# Patient Record
Sex: Female | Born: 1950 | Race: White | Hispanic: No | Marital: Married | Smoking: Never smoker
Health system: Southern US, Community
[De-identification: ages and names within clinical notes are randomized; demographics above are authoritative.]

## PROBLEM LIST (undated history)

## (undated) DIAGNOSIS — I4891 Unspecified atrial fibrillation: Secondary | ICD-10-CM

## (undated) HISTORY — PX: ABDOMINAL HYSTERECTOMY: SHX81

---

## 2013-03-30 ENCOUNTER — Encounter (HOSPITAL_COMMUNITY): Payer: Self-pay | Admitting: *Deleted

## 2013-03-30 ENCOUNTER — Emergency Department (HOSPITAL_COMMUNITY)
Admission: EM | Admit: 2013-03-30 | Discharge: 2013-03-30 | Disposition: A | Discharge status: Home / Self Care | Payer: BC Managed Care – PPO | Attending: Emergency Medicine | Admitting: Emergency Medicine

## 2013-03-30 ENCOUNTER — Emergency Department (HOSPITAL_COMMUNITY): Payer: BC Managed Care – PPO

## 2013-03-30 DIAGNOSIS — M25512 Pain in left shoulder: Secondary | ICD-10-CM

## 2013-03-30 DIAGNOSIS — M25519 Pain in unspecified shoulder: Secondary | ICD-10-CM | POA: Insufficient documentation

## 2013-03-30 DIAGNOSIS — Z7982 Long term (current) use of aspirin: Secondary | ICD-10-CM | POA: Insufficient documentation

## 2013-03-30 DIAGNOSIS — R52 Pain, unspecified: Secondary | ICD-10-CM | POA: Insufficient documentation

## 2013-03-30 MED ORDER — HYDROCODONE-ACETAMINOPHEN 5-325 MG PO TABS: 1.0000 | ORAL_TABLET | Freq: Four times a day (QID) | ORAL | Status: AC | PRN

## 2013-03-30 NOTE — ED Notes (Signed)
Pt c/o left shoulder soreness. Reports pain started on yesterday afternoon, denies injury. States shoulder is swollen and warm to touch. Reports hot shower improved pain slightly.

## 2013-03-30 NOTE — ED Provider Notes (Signed)
History     CSN: [id number]  Arrival date & time 03/30/13  1239   First MD Initiated Contact with Patient 03/30/13 1254      Chief Complaint  Patient presents with  . Shoulder Pain    (Consider location/radiation/quality/duration/timing/severity/associated sxs/prior treatment) HPI Comments: Patient presents emergency department with chief complaint of left shoulder pain. Patient states that she has been sore for quite a while, but that her pain recently worsened yesterday afternoon. She endorses repetitive shoulder movement, and she is a Runner, broadcasting/film/video, and rice on the board frequently. She also states it is worse while using the computer mouse. She states that her pain is 8/10. It is worsened with movement. She has tried aspirin and ice with some relief. She denies any traumatic event or injury. She denies fever, chills, nausea, and vomiting.  The history is provided by the patient. No language interpreter was used.    History reviewed. No pertinent past medical history.  Past Surgical History  Procedure Laterality Date  . Abdominal hysterectomy      History reviewed. No pertinent family history.  History  Substance Use Topics  . Smoking status: Not on file  . Smokeless tobacco: Not on file  . Alcohol Use: No    OB History   Grav Para Term Preterm Abortions TAB SAB Ect Mult Living                  Review of Systems  All other systems reviewed and are negative.    Allergies  Ibuprofen  Home Medications   Current Outpatient Rx  Name  Route  Sig  Dispense  Refill  . aspirin 81 MG tablet   Oral   Take 405 mg by mouth daily.         Marland Kitchen HYDROcodone-acetaminophen (NORCO/VICODIN) 5-325 MG per tablet   Oral   Take 1 tablet by mouth every 6 (six) hours as needed for pain.   13 tablet   0     BP 127/97  Pulse 97  Temp(Src) 98.4 F (36.9 C) (Oral)  Resp 16  SpO2 99%  Physical Exam  Nursing note and vitals reviewed. Constitutional: She is oriented to person,  place, and time. She appears well-developed and well-nourished.  HENT:  Head: Normocephalic and atraumatic.  Eyes: Conjunctivae and EOM are normal.  Neck: Normal range of motion.  Cardiovascular: Normal rate and intact distal pulses.   Brisk capillary refill  Pulmonary/Chest: Effort normal.  Abdominal: She exhibits no distension.  Musculoskeletal: Normal range of motion. She exhibits tenderness.  Pain over the left biceps tendon, positive Hawkins-Kennedy test, empty can test deferred secondary to pain, range of motion and strength limited secondary to pain, distal sensation intact  Neurological: She is alert and oriented to person, place, and time.  Sensation intact bilaterally  Skin: Skin is dry.  No redness, swelling, or increased warmth of left shoulder, no signs of infection  Psychiatric: She has a normal mood and affect. Her behavior is normal. Judgment and thought content normal.    ED Course  Procedures (including critical care time)  Labs Reviewed - No data to display No results found.   1. Shoulder pain, acute, left       MDM  Patient with left shoulder pain. Suspect biceps tendinitis, however cannot rule out adhesive capsulitis and shoulder impingement syndrome, or rotator cuff problem. Will refer to orthopedics for further evaluation. No obvious bony deformity or abnormality. Plain films not warranted at this time. Will prescribe Norco  for pain. Patient recommended to ice the affected area, and to stretch as tolerated. Patient understands and agrees with the plan. She is stable and ready for discharge.        Roxy Horseman, PA-C 03/30/13 1538

## 2013-03-30 NOTE — ED Provider Notes (Signed)
Medical screening examination/treatment/procedure(s) were performed by non-physician practitioner and as supervising physician I was immediately available for consultation/collaboration.  Emerald Shor, MD 03/30/13 1750 

## 2014-11-12 ENCOUNTER — Other Ambulatory Visit: Payer: Self-pay

## 2015-10-20 ENCOUNTER — Emergency Department (HOSPITAL_BASED_OUTPATIENT_CLINIC_OR_DEPARTMENT_OTHER)
Admission: EM | Admit: 2015-10-20 | Discharge: 2015-10-20 | Disposition: A | Discharge status: Home / Self Care | Payer: Medicare HMO | Attending: Emergency Medicine | Admitting: Emergency Medicine

## 2015-10-20 ENCOUNTER — Encounter (HOSPITAL_BASED_OUTPATIENT_CLINIC_OR_DEPARTMENT_OTHER): Payer: Self-pay | Admitting: Adult Health

## 2015-10-20 DIAGNOSIS — Z7982 Long term (current) use of aspirin: Secondary | ICD-10-CM | POA: Insufficient documentation

## 2015-10-20 DIAGNOSIS — M25512 Pain in left shoulder: Secondary | ICD-10-CM | POA: Insufficient documentation

## 2015-10-20 DIAGNOSIS — M25511 Pain in right shoulder: Secondary | ICD-10-CM | POA: Diagnosis not present

## 2015-10-20 DIAGNOSIS — R509 Fever, unspecified: Secondary | ICD-10-CM | POA: Diagnosis not present

## 2015-10-20 DIAGNOSIS — M542 Cervicalgia: Secondary | ICD-10-CM

## 2015-10-20 LAB — CBC WITH DIFFERENTIAL/PLATELET
BASOS PCT: 0 %
Basophils Absolute: 0 10*3/uL (ref 0.0–0.1)
Eosinophils Absolute: 0.1 10*3/uL (ref 0.0–0.7)
Eosinophils Relative: 1 %
HEMATOCRIT: 44 % (ref 36.0–46.0)
Hemoglobin: 14.5 g/dL (ref 12.0–15.0)
Lymphocytes Relative: 15 %
Lymphs Abs: 1.2 10*3/uL (ref 0.7–4.0)
MCH: 28.9 pg (ref 26.0–34.0)
MCHC: 33 g/dL (ref 30.0–36.0)
MCV: 87.6 fL (ref 78.0–100.0)
MONO ABS: 0.7 10*3/uL (ref 0.1–1.0)
MONOS PCT: 8 %
NEUTROS ABS: 6.3 10*3/uL (ref 1.7–7.7)
Neutrophils Relative %: 76 %
Platelets: 254 10*3/uL (ref 150–400)
RBC: 5.02 MIL/uL (ref 3.87–5.11)
RDW: 14 % (ref 11.5–15.5)
WBC: 8.3 10*3/uL (ref 4.0–10.5)

## 2015-10-20 LAB — URINALYSIS, ROUTINE W REFLEX MICROSCOPIC
BILIRUBIN URINE: NEGATIVE
Glucose, UA: NEGATIVE mg/dL
HGB URINE DIPSTICK: NEGATIVE
Ketones, ur: NEGATIVE mg/dL
Nitrite: NEGATIVE
PH: 6 (ref 5.0–8.0)
Protein, ur: NEGATIVE mg/dL
SPECIFIC GRAVITY, URINE: 1.013 (ref 1.005–1.030)
UROBILINOGEN UA: 0.2 mg/dL (ref 0.0–1.0)

## 2015-10-20 LAB — COMPREHENSIVE METABOLIC PANEL
ALBUMIN: 3.9 g/dL (ref 3.5–5.0)
ALK PHOS: 59 U/L (ref 38–126)
ALT: 29 U/L (ref 14–54)
AST: 29 U/L (ref 15–41)
Anion gap: 5 (ref 5–15)
BUN: 17 mg/dL (ref 6–20)
CALCIUM: 9.1 mg/dL (ref 8.9–10.3)
CO2: 27 mmol/L (ref 22–32)
CREATININE: 0.81 mg/dL (ref 0.44–1.00)
Chloride: 105 mmol/L (ref 101–111)
GFR calc Af Amer: >60 mL/min (ref >60)
GFR calc non Af Amer: >60 mL/min (ref >60)
GLUCOSE: 114 mg/dL — AB (ref 65–99)
Potassium: 4.4 mmol/L (ref 3.5–5.1)
SODIUM: 137 mmol/L (ref 135–145)
Total Bilirubin: 0.8 mg/dL (ref 0.3–1.2)
Total Protein: 7.8 g/dL (ref 6.5–8.1)

## 2015-10-20 LAB — URINE MICROSCOPIC-ADD ON

## 2015-10-20 LAB — I-STAT CG4 LACTIC ACID, ED: LACTIC ACID, VENOUS: 1.59 mmol/L (ref 0.5–2.0)

## 2015-10-20 MED ORDER — CYCLOBENZAPRINE HCL 10 MG PO TABS: 10.0000 mg | ORAL_TABLET | Freq: Two times a day (BID) | ORAL | Status: AC | PRN

## 2015-10-20 NOTE — ED Notes (Addendum)
Presents with neck and shoulder that is described as severe-difficulty moving head from side to side began Saturday-denies photobobia, rash, nausea, headache-endorses low grade fever of 100.7 and generalized fatigue-taking asa and flexeril with no relief. Pain described as throbbing.

## 2015-10-20 NOTE — ED Notes (Signed)
Patient verbalizes understanding of discharge follow-up and medications.  Patient stable and ambulatory.

## 2015-10-20 NOTE — ED Provider Notes (Signed)
CSN: [id number]     Arrival date & time 10/20/15  1909 History  By signing my name below, I, Gwenyth Ober, attest that this documentation has been prepared under the direction and in the presence of Marily Memos, MD.  Electronically Signed: Gwenyth Ober, ED Scribe. 10/20/2015. 8:24 PM.   Chief Complaint  Patient presents with  . Neck Pain   The history is provided by the patient. No language interpreter was used.    HPI Comments: Kimberly Johnston is a 64 y.o. female who presents to the Emergency Department complaining of constant, moderate, throbbing, posterior neck and bilateral shoulder pain that started last night. She states fever of 100.7 measured orally as associated symptoms. Pt also notes mild pain with swallowing that occurred yesterday, but resolved. Her pain becomes worse with moving her head. Pt tried Flexeril and aspirin PTA with some relief. She last took treatment 3 hours ago which improved her symptoms. Pt denies sick contact, recent injuries or regular OTC medications. She also denies HA, nausea, vomiting and rash as associated symptoms.  History reviewed. No pertinent past medical history. Past Surgical History  Procedure Laterality Date  . Abdominal hysterectomy     History reviewed. No pertinent family history. Social History  Substance Use Topics  . Smoking status: None  . Smokeless tobacco: None  . Alcohol Use: No   OB History    No data available     Review of Systems  Constitutional: Positive for fever.  HENT: Negative for trouble swallowing.   Gastrointestinal: Negative for nausea and vomiting.  Musculoskeletal: Positive for arthralgias and neck pain.  Skin: Negative for rash.  Neurological: Negative for headaches.  All other systems reviewed and are negative.  Allergies  Ibuprofen  Home Medications   Prior to Admission medications   Medication Sig Start Date End Date Taking? Authorizing Provider  aspirin 81 MG tablet Take 405 mg by  mouth daily.    Historical Provider, MD  cyclobenzaprine (FLEXERIL) 10 MG tablet Take 1 tablet (10 mg total) by mouth 2 (two) times daily as needed for muscle spasms. 10/20/15   Marily Memos, MD  HYDROcodone-acetaminophen (NORCO/VICODIN) 5-325 MG per tablet Take 1 tablet by mouth every 6 (six) hours as needed for pain. 03/30/13   Roxy Horseman, PA-C   BP 116/75 mmHg  Pulse 71  Temp(Src) 99 F (37.2 C) (Oral)  Resp 18  SpO2 98% Physical Exam  Constitutional: She is oriented to person, place, and time. She appears well-developed and well-nourished. No distress.  Afebrile  HENT:  Head: Normocephalic and atraumatic.  Mouth/Throat: No oropharyngeal exudate.  Mild erythema to back of throat  Eyes: Conjunctivae and EOM are normal. Pupils are equal, round, and reactive to light.  Neck: Normal range of motion. Neck supple. No rigidity. No tracheal deviation present.  Mild swelling of anterior cervical nodes, but nontender No midline cervical tenderness Mild paraspinal cervical tenderness  Cardiovascular: Normal rate, regular rhythm and normal heart sounds.   No tachycardia  Pulmonary/Chest: Effort normal and breath sounds normal. No respiratory distress. She has no wheezes.  Abdominal: Soft. She exhibits no distension. There is no tenderness. There is no rebound and no guarding.  Neurological: She is alert and oriented to person, place, and time. No cranial nerve deficit.  Skin: Skin is warm and dry. No rash noted.  Psychiatric: She has a normal mood and affect. Her behavior is normal.  Nursing note and vitals reviewed.   ED Course  Procedures   COORDINATION OF CARE:  8:20 PM Discussed low suspicion for meningitis, but gave strict return precautions including worsening fever, neck pain, petechiae rash and neurological deficits. Discussed treatment plan with pt which includes Flexeril. Pt agreed to plan.   Labs Review Labs Reviewed  COMPREHENSIVE METABOLIC PANEL - Abnormal; Notable for  the following:    Glucose, Bld 114 (*)    All other components within normal limits  URINALYSIS, ROUTINE W REFLEX MICROSCOPIC (NOT AT [facility]) - Abnormal; Notable for the following:    Leukocytes, UA TRACE (*)    All other components within normal limits  CBC WITH DIFFERENTIAL/PLATELET  URINE MICROSCOPIC-ADD ON  I-STAT CG4 LACTIC ACID, ED  I-STAT CG4 LACTIC ACID, ED    Imaging Review No results found. I have personally reviewed and evaluated these lab results as part of my medical decision-making.   EKG Interpretation None      MDM   Final diagnoses:  Neck pain   64 year old female with 2 days of intermittent bilateral and midline neck pain has resolved with aspirin and Flexeril. Also had one temperature 100.7 but is afebrile here. She's had no nausea, vomiting, vision changes or other symptoms. Does not have any back pain or headache. On exam she has normal vital signs and slight tender to palpation over her trapezius muscles. No nuchal rigidity. Labs are normal.  Affect patient likely has more muscular neck pain. No evidence of PTA or RPA. I did consider meningitis however she is afebrile here has no other signs or symptoms of meningitis. I discussed the chances of this being present with her she stated she was asymptomatic at this time without any discomfort and would prefer to defer doing a lumbar puncture unless her symptoms were to come back. I discussed with her that we could miss a life threatening bacterial infection however I thought it was unlikely and she understood this and agreed to be discharged with a structures. She will return here if the symptoms return.   I have personally and contemperaneously reviewed labs and imaging and used in my decision making as above.   A medical screening exam was performed and I feel the patient has had an appropriate workup for their chief complaint at this time and likelihood of emergent condition existing is low. They have been counseled  on decision, discharge, follow up and which symptoms necessitate immediate return to the emergency department. They or their family verbally stated understanding and agreement with plan and discharged in stable condition.     I personally performed the services described in this documentation, which was scribed in my presence. The recorded information has been reviewed and is accurate.    Marily MemosJason Yordy Matton, MD 10/20/15 2216

## 2016-12-31 ENCOUNTER — Emergency Department (HOSPITAL_BASED_OUTPATIENT_CLINIC_OR_DEPARTMENT_OTHER)
Admission: EM | Admit: 2016-12-31 | Discharge: 2016-12-31 | Disposition: A | Discharge status: Home / Self Care | Payer: Medicare HMO | Attending: Emergency Medicine | Admitting: Emergency Medicine

## 2016-12-31 ENCOUNTER — Emergency Department (HOSPITAL_BASED_OUTPATIENT_CLINIC_OR_DEPARTMENT_OTHER): Payer: Medicare HMO

## 2016-12-31 ENCOUNTER — Encounter (HOSPITAL_BASED_OUTPATIENT_CLINIC_OR_DEPARTMENT_OTHER): Payer: Self-pay | Admitting: Emergency Medicine

## 2016-12-31 DIAGNOSIS — Y939 Activity, unspecified: Secondary | ICD-10-CM | POA: Insufficient documentation

## 2016-12-31 DIAGNOSIS — Y929 Unspecified place or not applicable: Secondary | ICD-10-CM | POA: Insufficient documentation

## 2016-12-31 DIAGNOSIS — W1809XA Striking against other object with subsequent fall, initial encounter: Secondary | ICD-10-CM | POA: Diagnosis not present

## 2016-12-31 DIAGNOSIS — Z7982 Long term (current) use of aspirin: Secondary | ICD-10-CM | POA: Insufficient documentation

## 2016-12-31 DIAGNOSIS — S8012XA Contusion of left lower leg, initial encounter: Secondary | ICD-10-CM | POA: Insufficient documentation

## 2016-12-31 DIAGNOSIS — Z79899 Other long term (current) drug therapy: Secondary | ICD-10-CM | POA: Diagnosis not present

## 2016-12-31 DIAGNOSIS — Y999 Unspecified external cause status: Secondary | ICD-10-CM | POA: Insufficient documentation

## 2016-12-31 DIAGNOSIS — S8992XA Unspecified injury of left lower leg, initial encounter: Secondary | ICD-10-CM | POA: Diagnosis present

## 2016-12-31 HISTORY — DX: Unspecified atrial fibrillation: I48.91

## 2016-12-31 NOTE — ED Notes (Signed)
States fell a couple weeks ago and has pain and swelling and redness to left lower leg. Bruising noted to left foot, ankle and lower leg with reddened area to shin area.

## 2016-12-31 NOTE — ED Triage Notes (Signed)
Pt reports bumping L leg on a table 2 weeks ago. Pt states pain and bruising is worse. Large bruise noted to inner L calf going down to her foot. Pt also has a red, hot area to her calf.

## 2016-12-31 NOTE — Discharge Instructions (Signed)
Elevate your leg as much as possible above your heart. Take Tylenol for pain. See your primary care physician or cruise ship  if not improving in a week. Return if concern for any reason.

## 2016-12-31 NOTE — ED Provider Notes (Signed)
[facility]   CSN: [id number] Arrival date & time: 12/31/16  1044     History   Chief Complaint Chief Complaint  Patient presents with  . Leg Injury    HPI Kimberly Johnston is a 66 y.o. female.  HPI she bumped her left shin on a table approximately 2 weeks ago. She complains of pain swelling and bruising and shin feels hot since the event. She denies other injury. Pain is worse with weightbearing improved with remaining still. No other associated symptoms. She's treated  herself with extra doses of aspirin  Past Medical History:  Diagnosis Date  . Atrial fibrillation (HCC)     There are no active problems to display for this patient.   Past Surgical History:  Procedure Laterality Date  . ABDOMINAL HYSTERECTOMY      OB History    No data available       Home Medications    Prior to Admission medications   Medication Sig Start Date End Date Taking? Authorizing Provider  aspirin 81 MG tablet Take 405 mg by mouth daily.   Yes Historical Provider, MD  diltiazem (CARDIZEM) 120 MG tablet Take 120 mg by mouth 4 (four) times daily.   Yes Historical Provider, MD  cyclobenzaprine (FLEXERIL) 10 MG tablet Take 1 tablet (10 mg total) by mouth 2 (two) times daily as needed for muscle spasms. 10/20/15   Marily Memos, MD  HYDROcodone-acetaminophen (NORCO/VICODIN) 5-325 MG per tablet Take 1 tablet by mouth every 6 (six) hours as needed for pain. 03/30/13   Roxy Horseman, PA-C    Family History No family history on file.  Social History Social History  Substance Use Topics  . Smoking status: Never Smoker  . Smokeless tobacco: Never Used  . Alcohol use No     Allergies   Ibuprofen   Review of Systems Review of Systems  Musculoskeletal:       Left shin pain and swelling  Hematological: Bruises/bleeds easily.     Physical Exam Updated Vital Signs BP 127/76 (BP Location: Right Arm)   Pulse (!) 59   Temp 98.5 F (36.9 C) (Oral)   Resp  16   Ht 5\' 8"  (1.727 m)   Wt 140 lb (63.5 kg)   SpO2 97%   BMI 21.29 kg/m   Physical Exam  Constitutional: She appears well-developed and well-nourished. No distress.  HENT:  Head: Normocephalic and atraumatic.  Eyes: EOM are normal.  Pulmonary/Chest: Effort normal.  Abdominal: She exhibits no distension.  Musculoskeletal:  Right lower extremity mildly swollen ecchymotic at shin and dorsum of foot. Tender at the juncture of middle and distal thirds of the shin. DP pulses intact. Skin is intact. All other extremities or contusion abrasion or tenderness neurovascularly intact  Nursing note and vitals reviewed.    ED Treatments / Results  Labs (all labs ordered are listed, but only abnormal results are displayed) Labs Reviewed - No data to display  EKG  EKG Interpretation None       Radiology No results found.  Procedures Procedures (including critical care time)  Medications Ordered in ED Medications - No data to display Results for orders placed or performed during the hospital encounter of 10/20/15  Comprehensive metabolic panel  Result Value Ref Range   Sodium 137 135 - 145 mmol/L   Potassium 4.4 3.5 - 5.1 mmol/L   Chloride 105 101 - 111 mmol/L   CO2 27 22 - 32 mmol/L   Glucose, Bld 114 (  H) 65 - 99 mg/dL   BUN 17 6 - 20 mg/dL   Creatinine, Ser 4.09 0.44 - 1.00 mg/dL   Calcium 9.1 8.9 - 81.1 mg/dL   Total Protein 7.8 6.5 - 8.1 g/dL   Albumin 3.9 3.5 - 5.0 g/dL   AST 29 15 - 41 U/L   ALT 29 14 - 54 U/L   Alkaline Phosphatase 59 38 - 126 U/L   Total Bilirubin 0.8 0.3 - 1.2 mg/dL   GFR calc non Af Amer >60 >60 mL/min   GFR calc Af Amer >60 >60 mL/min   Anion gap 5 5 - 15  CBC with Differential  Result Value Ref Range   WBC 8.3 4.0 - 10.5 K/uL   RBC 5.02 3.87 - 5.11 MIL/uL   Hemoglobin 14.5 12.0 - 15.0 g/dL   HCT 91.4 78.2 - 95.6 %   MCV 87.6 78.0 - 100.0 fL   MCH 28.9 26.0 - 34.0 pg   MCHC 33.0 30.0 - 36.0 g/dL   RDW 21.3 08.6 - 57.8 %   Platelets  254 150 - 400 K/uL   Neutrophils Relative % 76 %   Neutro Abs 6.3 1.7 - 7.7 K/uL   Lymphocytes Relative 15 %   Lymphs Abs 1.2 0.7 - 4.0 K/uL   Monocytes Relative 8 %   Monocytes Absolute 0.7 0.1 - 1.0 K/uL   Eosinophils Relative 1 %   Eosinophils Absolute 0.1 0.0 - 0.7 K/uL   Basophils Relative 0 %   Basophils Absolute 0.0 0.0 - 0.1 K/uL  Urinalysis, Routine w reflex microscopic (not at [facility])  Result Value Ref Range   Color, Urine YELLOW YELLOW   APPearance CLEAR CLEAR   Specific Gravity, Urine 1.013 1.005 - 1.030   pH 6.0 5.0 - 8.0   Glucose, UA NEGATIVE NEGATIVE mg/dL   Hgb urine dipstick NEGATIVE NEGATIVE   Bilirubin Urine NEGATIVE NEGATIVE   Ketones, ur NEGATIVE NEGATIVE mg/dL   Protein, ur NEGATIVE NEGATIVE mg/dL   Urobilinogen, UA 0.2 0.0 - 1.0 mg/dL   Nitrite NEGATIVE NEGATIVE   Leukocytes, UA TRACE (A) NEGATIVE  Urine microscopic-add on  Result Value Ref Range   Squamous Epithelial / LPF RARE RARE   WBC, UA 0-2 <3 WBC/hpf   Bacteria, UA RARE RARE  I-Stat CG4 Lactic Acid, ED (Not at [facility])  Result Value Ref Range   Lactic Acid, Venous 1.59 0.5 - 2.0 mmol/L   Dg Tibia/fibula Left  Result Date: 12/31/2016 CLINICAL DATA:  Blunt trauma 2 weeks ago with large bruise, initial encounter EXAM: LEFT TIBIA AND FIBULA - 2 VIEW COMPARISON:  None. FINDINGS: No acute fracture or dislocation is noted. Some soft tissue density is noted consistent with the known history of bruising. No other focal abnormality is seen. IMPRESSION: No acute bony abnormality noted. Electronically Signed   By: Alcide Clever M.D.   On: 12/31/2016 11:56   X-ray viewed by me. No evidence of cellulitis or DVT. Initial Impression / Assessment and Plan / ED Course  I have reviewed the triage vital signs and the nursing notes.  Pertinent labs & imaging results that were available during my care of the patient were reviewed by me and considered in my medical decision making (see chart for details).  Clinical  Course     Plan elevation Tylenol. Follow-up with PMD or cruise ship doctor (patient is leaving on a cruise tomorrow) if not better in a week Final Clinical Impressions(s) / ED Diagnoses  Diagnosis contusion of  left leg Final diagnoses:  None    New Prescriptions New Prescriptions   No medications on file     Doug SouSam Guadalupe Nickless, MD 12/31/16 1312

## 2017-08-05 IMAGING — DX DG TIBIA/FIBULA 2V*L*
4 series · 4 of 4 positions shown · non-contrast
Comparison: None.

CLINICAL DATA: Blunt trauma 2 weeks ago with large bruise, initial
encounter

EXAM:
LEFT TIBIA AND FIBULA - 2 VIEW

[tibia ap (1 of 2)]
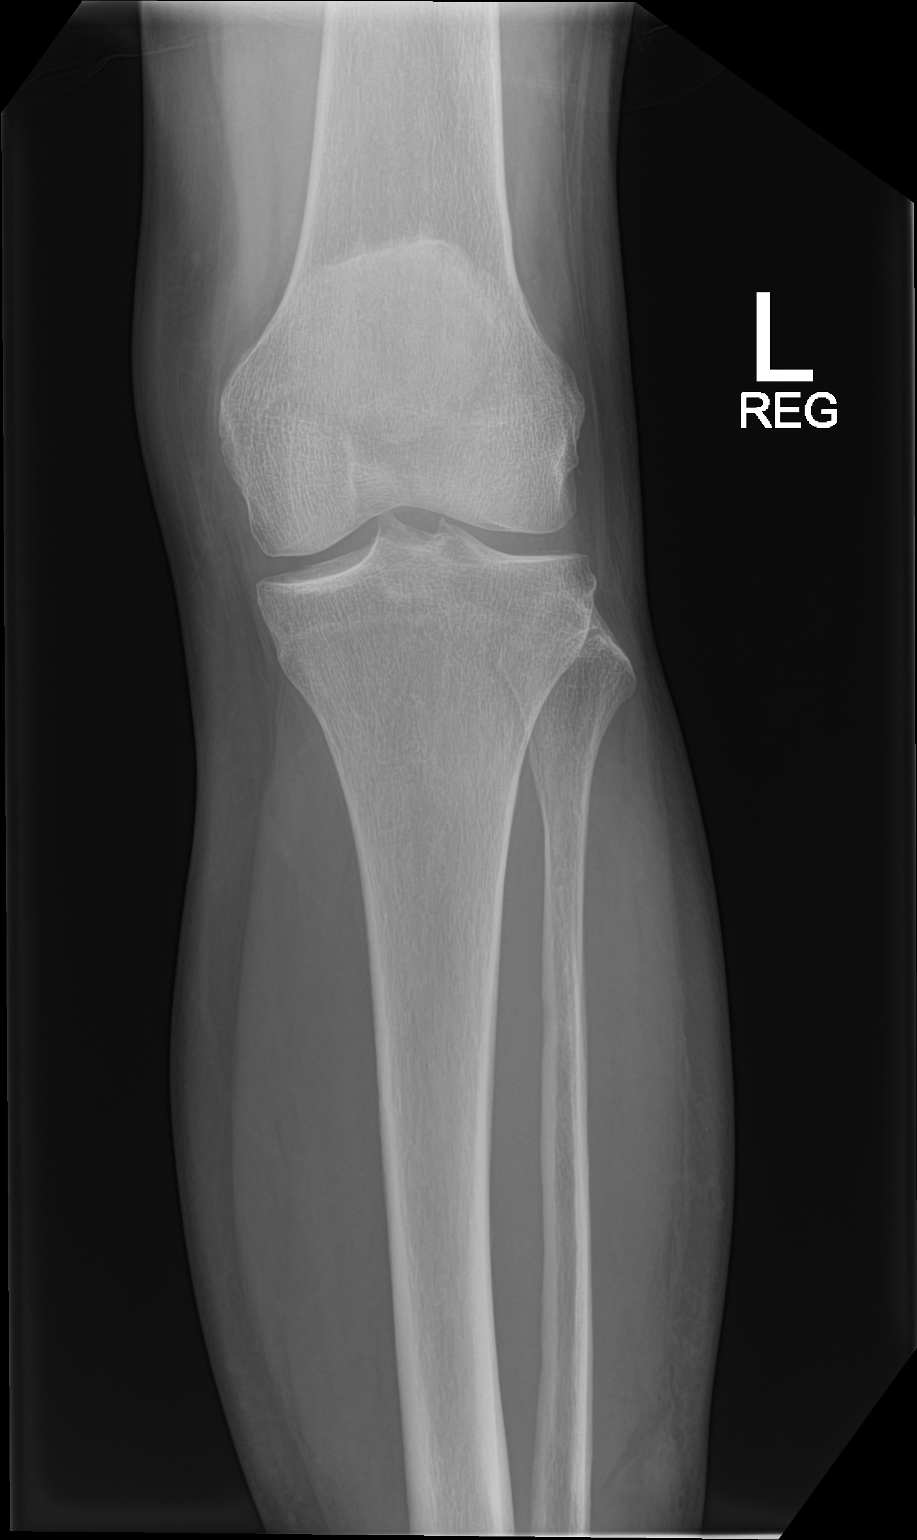

[tibia ap (2 of 2)]
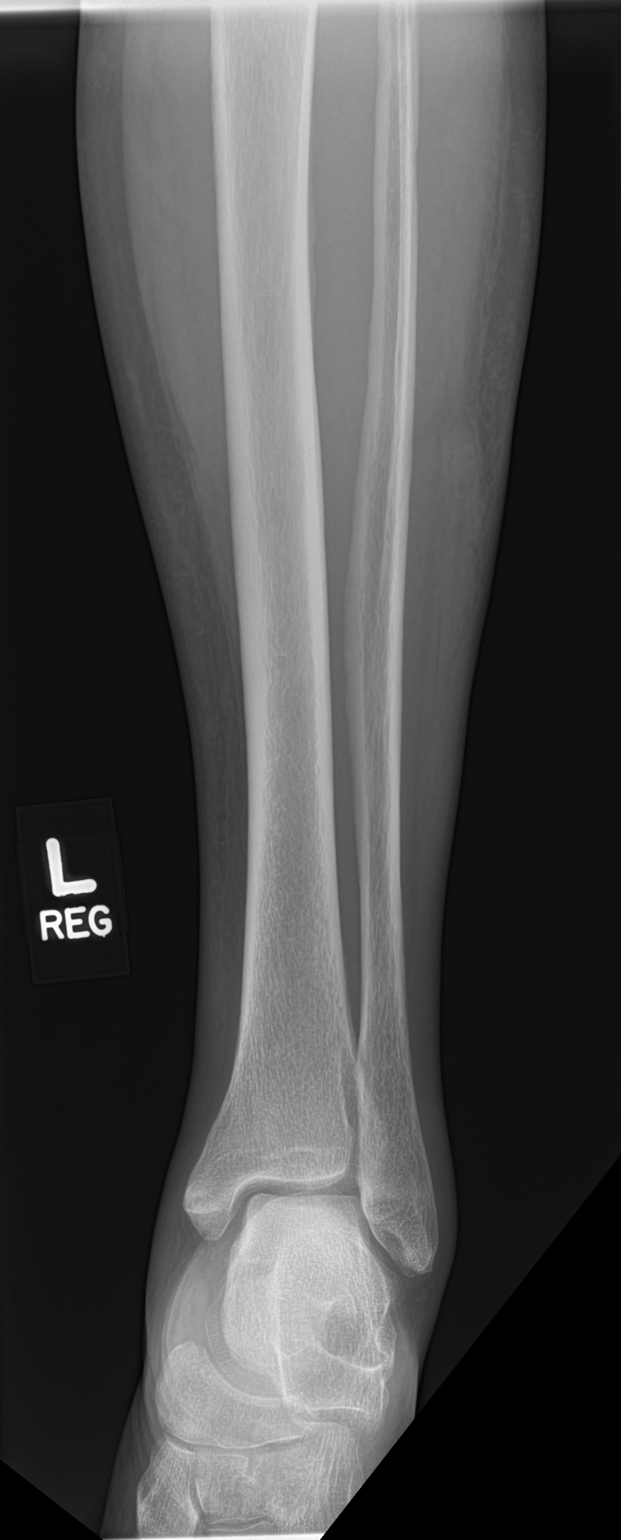

[tibia lat (1 of 2)]
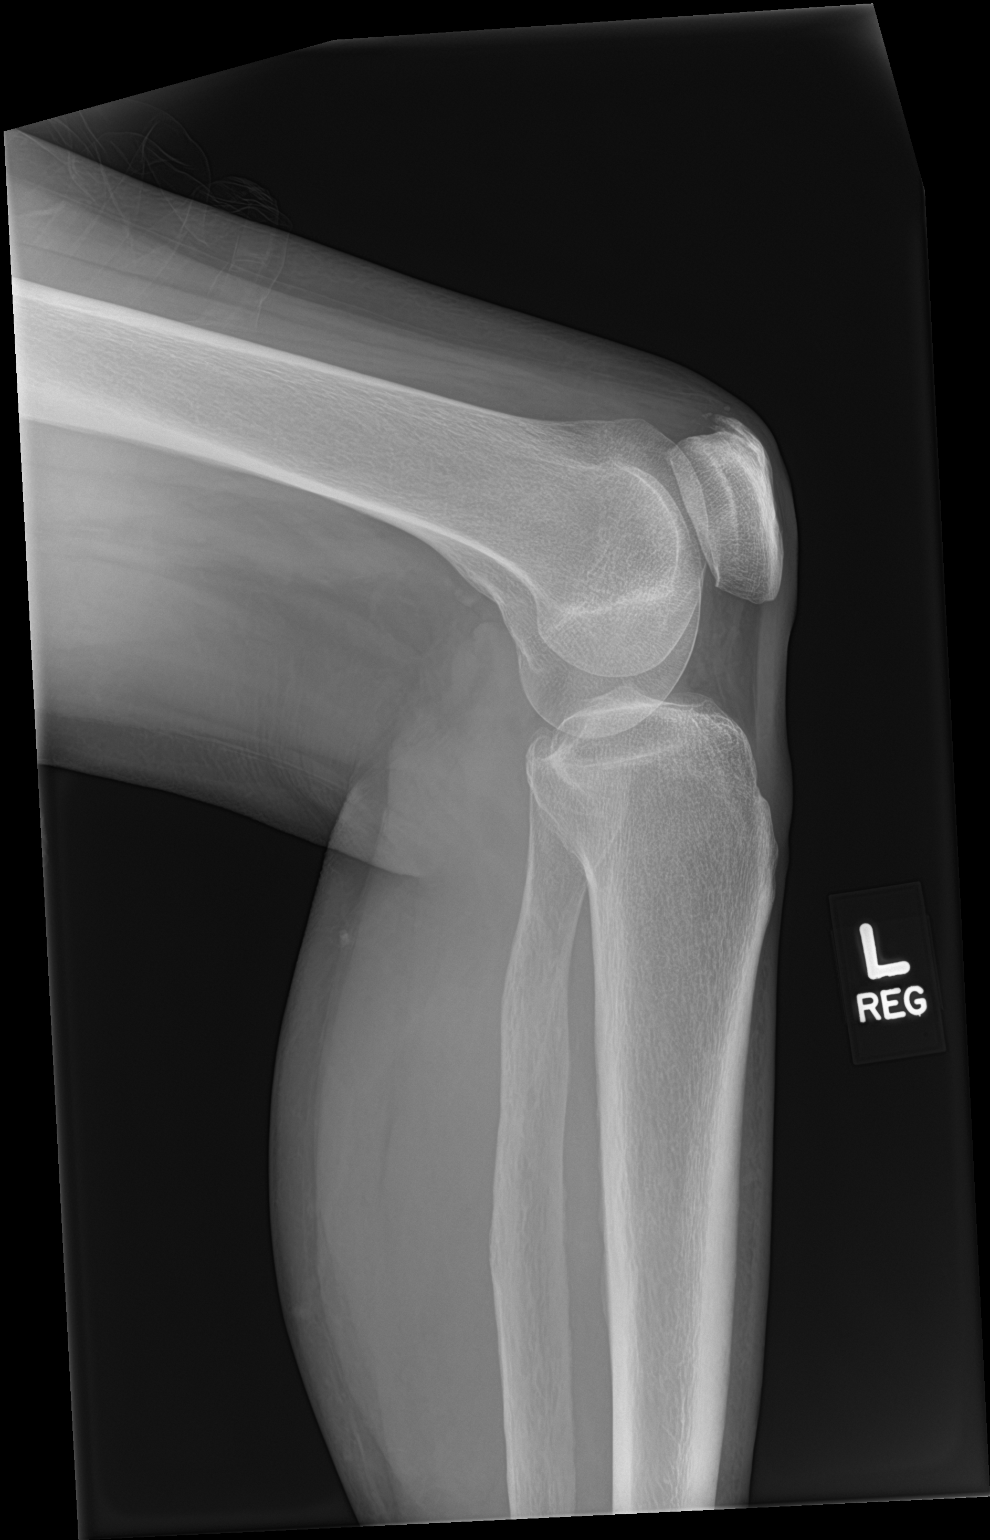

[tibia lat (2 of 2)]
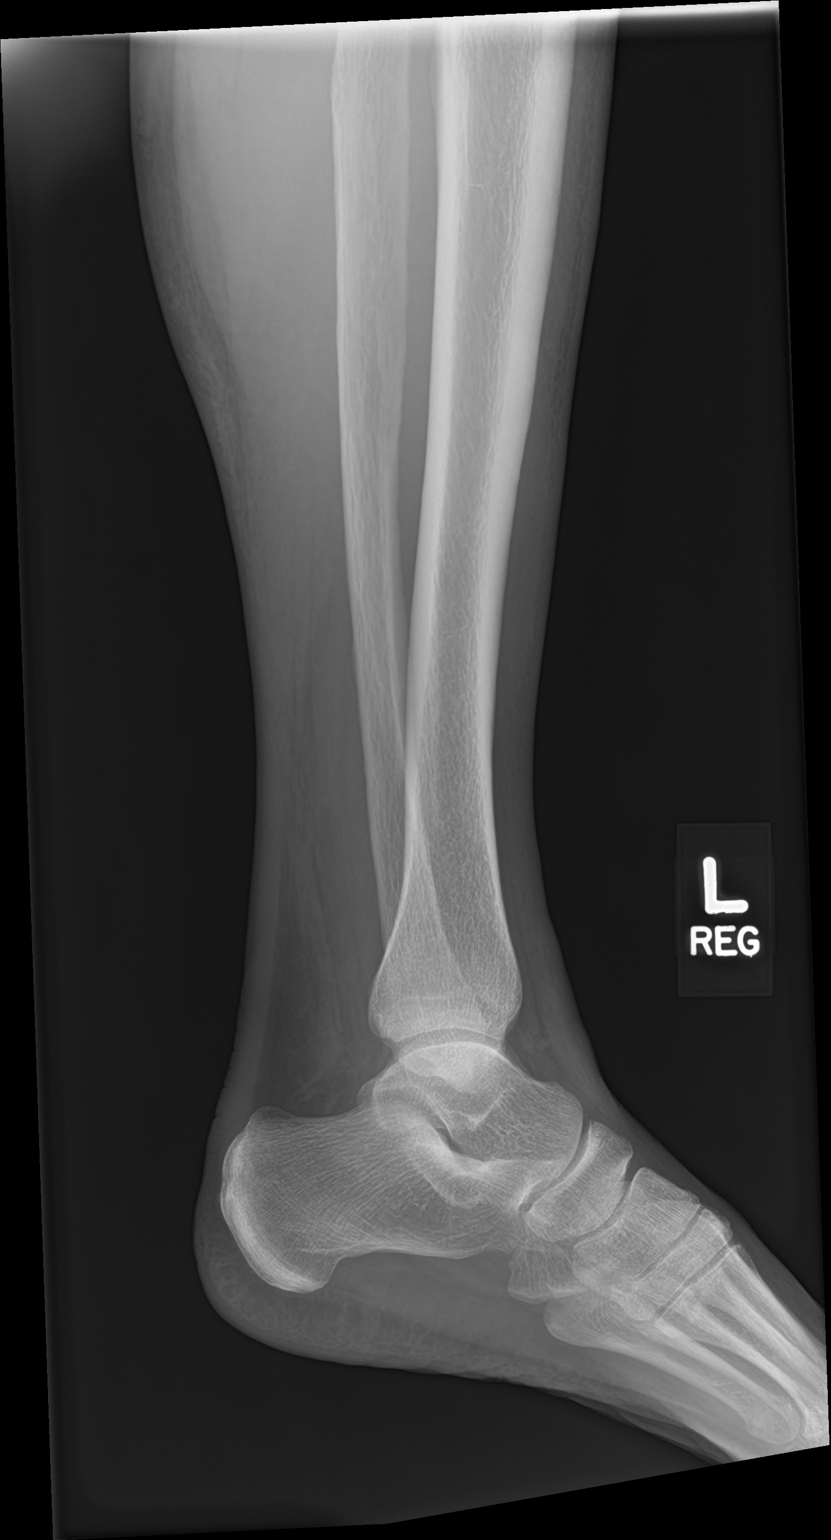

[4 of 4 positions shown; findings below may reference images not displayed]

FINDINGS: No acute fracture or dislocation is noted. Some soft tissue density
is noted consistent with the known history of bruising. No other
focal abnormality is seen.
IMPRESSION: No acute bony abnormality noted.

## 2020-05-25 ENCOUNTER — Emergency Department (HOSPITAL_BASED_OUTPATIENT_CLINIC_OR_DEPARTMENT_OTHER): Payer: Medicare HMO

## 2020-05-25 ENCOUNTER — Emergency Department (HOSPITAL_BASED_OUTPATIENT_CLINIC_OR_DEPARTMENT_OTHER)
Admission: EM | Admit: 2020-05-25 | Discharge: 2020-05-25 | Disposition: A | Discharge status: Home / Self Care | Payer: Medicare HMO | Attending: Emergency Medicine | Admitting: Emergency Medicine

## 2020-05-25 ENCOUNTER — Other Ambulatory Visit: Payer: Self-pay

## 2020-05-25 ENCOUNTER — Encounter (HOSPITAL_BASED_OUTPATIENT_CLINIC_OR_DEPARTMENT_OTHER): Payer: Self-pay | Admitting: *Deleted

## 2020-05-25 DIAGNOSIS — Z7982 Long term (current) use of aspirin: Secondary | ICD-10-CM | POA: Insufficient documentation

## 2020-05-25 DIAGNOSIS — Z79899 Other long term (current) drug therapy: Secondary | ICD-10-CM | POA: Diagnosis not present

## 2020-05-25 DIAGNOSIS — J069 Acute upper respiratory infection, unspecified: Secondary | ICD-10-CM | POA: Diagnosis not present

## 2020-05-25 DIAGNOSIS — Z20822 Contact with and (suspected) exposure to covid-19: Secondary | ICD-10-CM | POA: Diagnosis not present

## 2020-05-25 DIAGNOSIS — R509 Fever, unspecified: Secondary | ICD-10-CM | POA: Diagnosis present

## 2020-05-25 LAB — CBC WITH DIFFERENTIAL/PLATELET
Abs Immature Granulocytes: 0.01 10*3/uL (ref 0.00–0.07)
Basophils Absolute: 0 10*3/uL (ref 0.0–0.1)
Basophils Relative: 1 %
Eosinophils Absolute: 0.1 10*3/uL (ref 0.0–0.5)
Eosinophils Relative: 2 %
HCT: 46.8 % — ABNORMAL HIGH (ref 36.0–46.0)
Hemoglobin: 15.7 g/dL — ABNORMAL HIGH (ref 12.0–15.0)
Immature Granulocytes: 0 %
Lymphocytes Relative: 17 %
Lymphs Abs: 0.9 10*3/uL (ref 0.7–4.0)
MCH: 29.3 pg (ref 26.0–34.0)
MCHC: 33.5 g/dL (ref 30.0–36.0)
MCV: 87.3 fL (ref 80.0–100.0)
Monocytes Absolute: 0.6 10*3/uL (ref 0.1–1.0)
Monocytes Relative: 12 %
Neutro Abs: 3.4 10*3/uL (ref 1.7–7.7)
Neutrophils Relative %: 68 %
Platelets: 233 10*3/uL (ref 150–400)
RBC: 5.36 MIL/uL — ABNORMAL HIGH (ref 3.87–5.11)
RDW: 13.3 % (ref 11.5–15.5)
WBC: 5 10*3/uL (ref 4.0–10.5)
nRBC: 0 % (ref 0.0–0.2)

## 2020-05-25 LAB — COMPREHENSIVE METABOLIC PANEL
ALT: 19 U/L (ref 0–44)
AST: 24 U/L (ref 15–41)
Albumin: 4.1 g/dL (ref 3.5–5.0)
Alkaline Phosphatase: 62 U/L (ref 38–126)
Anion gap: 9 (ref 5–15)
BUN: 13 mg/dL (ref 8–23)
CO2: 23 mmol/L (ref 22–32)
Calcium: 9.1 mg/dL (ref 8.9–10.3)
Chloride: 104 mmol/L (ref 98–111)
Creatinine, Ser: 0.97 mg/dL (ref 0.44–1.00)
GFR calc Af Amer: >60 mL/min (ref >60)
GFR calc non Af Amer: 59 mL/min — ABNORMAL LOW (ref >60)
Glucose, Bld: 108 mg/dL — ABNORMAL HIGH (ref 70–99)
Potassium: 4 mmol/L (ref 3.5–5.1)
Sodium: 136 mmol/L (ref 135–145)
Total Bilirubin: 0.6 mg/dL (ref 0.3–1.2)
Total Protein: 7.3 g/dL (ref 6.5–8.1)

## 2020-05-25 LAB — SARS CORONAVIRUS 2 BY RT PCR (HOSPITAL ORDER, PERFORMED IN ~~LOC~~ HOSPITAL LAB): SARS Coronavirus 2: NEGATIVE

## 2020-05-25 MED ORDER — ONDANSETRON 4 MG PO TBDP: ORAL_TABLET | ORAL | 0 refills | Status: AC

## 2020-05-25 MED ORDER — BENZONATATE 100 MG PO CAPS: 100.0000 mg | ORAL_CAPSULE | Freq: Three times a day (TID) | ORAL | 0 refills | Status: AC

## 2020-05-25 NOTE — Discharge Instructions (Signed)
Your chest x-ray did not show a pneumonia and your lab work did not show a concerning finding that would make you feel weak and rundown.  As we discussed before most likely this is caused by a viral illness.  As you have already had your coronavirus vaccination this is unlikely to be the cause however we did send a test which should come back later today.   Take tylenol 2 pills 4 times a day and motrin 4 pills 3 times a day.  Drink plenty of fluids.  Return for worsening shortness of breath, headache, confusion. Follow up with your family doctor.

## 2020-05-25 NOTE — ED Provider Notes (Signed)
[facility] EMERGENCY DEPARTMENT Provider Note   CSN: [id number] Arrival date & time: 05/25/20  1548     History Chief Complaint  Patient presents with  . Fever  . Cough    MALLOREY ODONELL is a 69 y.o. female.  69 yo F with a chief complaints of cough and congestion.  Going on for a few days started as a sore throat and she feels its migrated down to her chest.  Feels that she is lost her appetite and lost her sense of smell.  No known sick contacts.  Went to [place] about a month ago but denies foreign travel.  Denies chest pain or pressure.  Denies abdominal pain nausea vomiting diarrhea.  Has had both coronavirus vaccinations.  The history is provided by the patient.  Fever Associated symptoms: cough   Associated symptoms: no chest pain, no chills, no congestion, no dysuria, no headaches, no myalgias, no nausea, no rhinorrhea and no vomiting   Cough Associated symptoms: fever   Associated symptoms: no chest pain, no chills, no headaches, no myalgias, no rhinorrhea, no shortness of breath and no wheezing   Illness Severity:  Moderate Onset quality:  Gradual Duration:  3 days Timing:  Constant Progression:  Worsening Chronicity:  New Associated symptoms: cough and fever   Associated symptoms: no abdominal pain, no chest pain, no congestion, no headaches, no myalgias, no nausea, no rhinorrhea, no shortness of breath, no vomiting and no wheezing        Past Medical History:  Diagnosis Date  . Atrial fibrillation (HCC)     There are no problems to display for this patient.   Past Surgical History:  Procedure Laterality Date  . ABDOMINAL HYSTERECTOMY       OB History   No obstetric history on file.     No family history on file.  Social History   Tobacco Use  . Smoking status: Never Smoker  . Smokeless tobacco: Never Used  Substance Use Topics  . Alcohol use: No  . Drug use: No    Home Medications Prior to Admission medications    Medication Sig Start Date End Date Taking? Authorizing Provider  aspirin 81 MG tablet Take 405 mg by mouth daily.   Yes [provider]  diltiazem (CARDIZEM) 120 MG tablet Take 120 mg by mouth 4 (four) times daily.   Yes [provider]  benzonatate (TESSALON) 100 MG capsule Take 1 capsule (100 mg total) by mouth every 8 (eight) hours. 05/25/20   Melene Plan, DO  cyclobenzaprine (FLEXERIL) 10 MG tablet Take 1 tablet (10 mg total) by mouth 2 (two) times daily as needed for muscle spasms. 10/20/15   Mesner, Barbara Cower, MD  HYDROcodone-acetaminophen (NORCO/VICODIN) 5-325 MG per tablet Take 1 tablet by mouth every 6 (six) hours as needed for pain. 03/30/13   Roxy Horseman, PA-C  ondansetron (ZOFRAN ODT) 4 MG disintegrating tablet 4mg  ODT q4 hours prn nausea/vomit 05/25/20   05/27/20, DO    Allergies    Ibuprofen  Review of Systems   Review of Systems  Constitutional: Positive for fever. Negative for chills.  HENT: Negative for congestion and rhinorrhea.   Eyes: Negative for redness and visual disturbance.  Respiratory: Positive for cough. Negative for shortness of breath and wheezing.   Cardiovascular: Negative for chest pain and palpitations.  Gastrointestinal: Negative for abdominal pain, nausea and vomiting.       Decreased appetite  Genitourinary: Negative for dysuria and urgency.  Musculoskeletal: Negative for  arthralgias and myalgias.  Skin: Negative for pallor and wound.  Neurological: Negative for dizziness and headaches.    Physical Exam Updated Vital Signs BP 139/83 (BP Location: Right Arm)   Pulse 80   Temp 99.1 F (37.3 C) (Oral)   Resp 16   Ht 5\' 8"  (1.727 m)   Wt 66.2 kg   SpO2 95%   BMI 22.20 kg/m   Physical Exam Vitals and nursing note reviewed.  Constitutional:      General: She is not in acute distress.    Appearance: She is well-developed. She is not diaphoretic.  HENT:     Head: Normocephalic and atraumatic.     Comments: Swollen  turbinates, posterior nasal drip, no noted sinus ttp, tm with a serous effusion bilaterally without erythema or bulging.  Eyes:     Pupils: Pupils are equal, round, and reactive to light.  Cardiovascular:     Rate and Rhythm: Normal rate and regular rhythm.     Heart sounds: No murmur. No friction rub. No gallop.   Pulmonary:     Effort: Pulmonary effort is normal.     Breath sounds: No wheezing or rales.  Abdominal:     General: There is no distension.     Palpations: Abdomen is soft.     Tenderness: There is no abdominal tenderness.  Musculoskeletal:        General: No tenderness.     Cervical back: Normal range of motion and neck supple.  Skin:    General: Skin is warm and dry.  Neurological:     Mental Status: She is alert and oriented to person, place, and time.  Psychiatric:        Behavior: Behavior normal.     ED Results / Procedures / Treatments   Labs (all labs ordered are listed, but only abnormal results are displayed) Labs Reviewed  CBC WITH DIFFERENTIAL/PLATELET - Abnormal; Notable for the following components:      Result Value   RBC 5.36 (*)    Hemoglobin 15.7 (*)    HCT 46.8 (*)    All other components within normal limits  COMPREHENSIVE METABOLIC PANEL - Abnormal; Notable for the following components:   Glucose, Bld 108 (*)    GFR calc non Af Amer 59 (*)    All other components within normal limits  SARS CORONAVIRUS 2 BY RT PCR (HOSPITAL ORDER, PERFORMED IN [facility] LAB)    EKG None  Radiology DG Chest Port 1 View  Result Date: 05/25/2020 CLINICAL DATA:  Cough and fever. EXAM: PORTABLE CHEST 1 VIEW COMPARISON:  None. FINDINGS: The heart size and mediastinal contours are within normal limits. Both lungs are clear. The visualized skeletal structures are unremarkable. IMPRESSION: No active disease. Electronically Signed   By: 05/27/2020 III M.D   On: 05/25/2020 16:41    Procedures Procedures (including critical care time)   Medications Ordered in ED Medications - No data to display  ED Course  I have reviewed the triage vital signs and the nursing notes.  Pertinent labs & imaging results that were available during my care of the patient were reviewed by me and considered in my medical decision making (see chart for details).    MDM Rules/Calculators/A&P                      69 yo F with a chief complaints of cough congestion loss of smell and decreased appetite.  Going on for a  few days now.  She is well-appearing and nontoxic.  Is clear lung sounds for me.  Chest x-ray viewed by me without focal infiltrate.  Laboratory evaluation without significant anemia or electrolyte abnormality to cause her weakness.  Most likely this is a viral syndrome.  As this is occurring during the novel coronavirus pandemic will send off a coronavirus test.  Rehema Muffley [facility] was evaluated in Emergency Department on 05/25/2020 for the symptoms described in the history of present illness. He/she was evaluated in the context of the global COVID-19 pandemic, which necessitated consideration that the patient might be at risk for infection with the SARS-CoV-2 virus that causes COVID-19. Institutional protocols and algorithms that pertain to the evaluation of patients at risk for COVID-19 are in a state of rapid change based on information released by regulatory bodies including the CDC and federal and state organizations. These policies and algorithms were followed during the patient's care in the ED.   5:49 PM:  I have discussed the diagnosis/risks/treatment options with the patient and believe the pt to be eligible for discharge home to follow-up with PCP. We also discussed returning to the ED immediately if new or worsening sx occur. We discussed the sx which are most concerning (e.g., sudden worsening pain, fever, inability to tolerate by mouth) that necessitate immediate return. Medications administered to the patient during their visit  and any new prescriptions provided to the patient are listed below.  Medications given during this visit Medications - No data to display   The patient appears reasonably screen and/or stabilized for discharge and I doubt any other medical condition or other [facility] requiring further screening, evaluation, or treatment in the ED at this time prior to discharge.   Final Clinical Impression(s) / ED Diagnoses Final diagnoses:  Viral upper respiratory tract infection    Rx / DC Orders ED Discharge Orders         Ordered    benzonatate (TESSALON) 100 MG capsule  Every 8 hours     05/25/20 1737    ondansetron (ZOFRAN ODT) 4 MG disintegrating tablet     05/25/20 [place], [place], DO 05/25/20 1749

## 2020-05-25 NOTE — ED Triage Notes (Signed)
Cough, fever, sore throat, congestion. Loss of taste.

## 2020-12-28 IMAGING — DX DG CHEST 1V PORT
1 series · 1 of 1 positions shown · non-contrast
Comparison: None.

CLINICAL DATA: Cough and fever.

EXAM:
PORTABLE CHEST 1 VIEW

[chest ap]
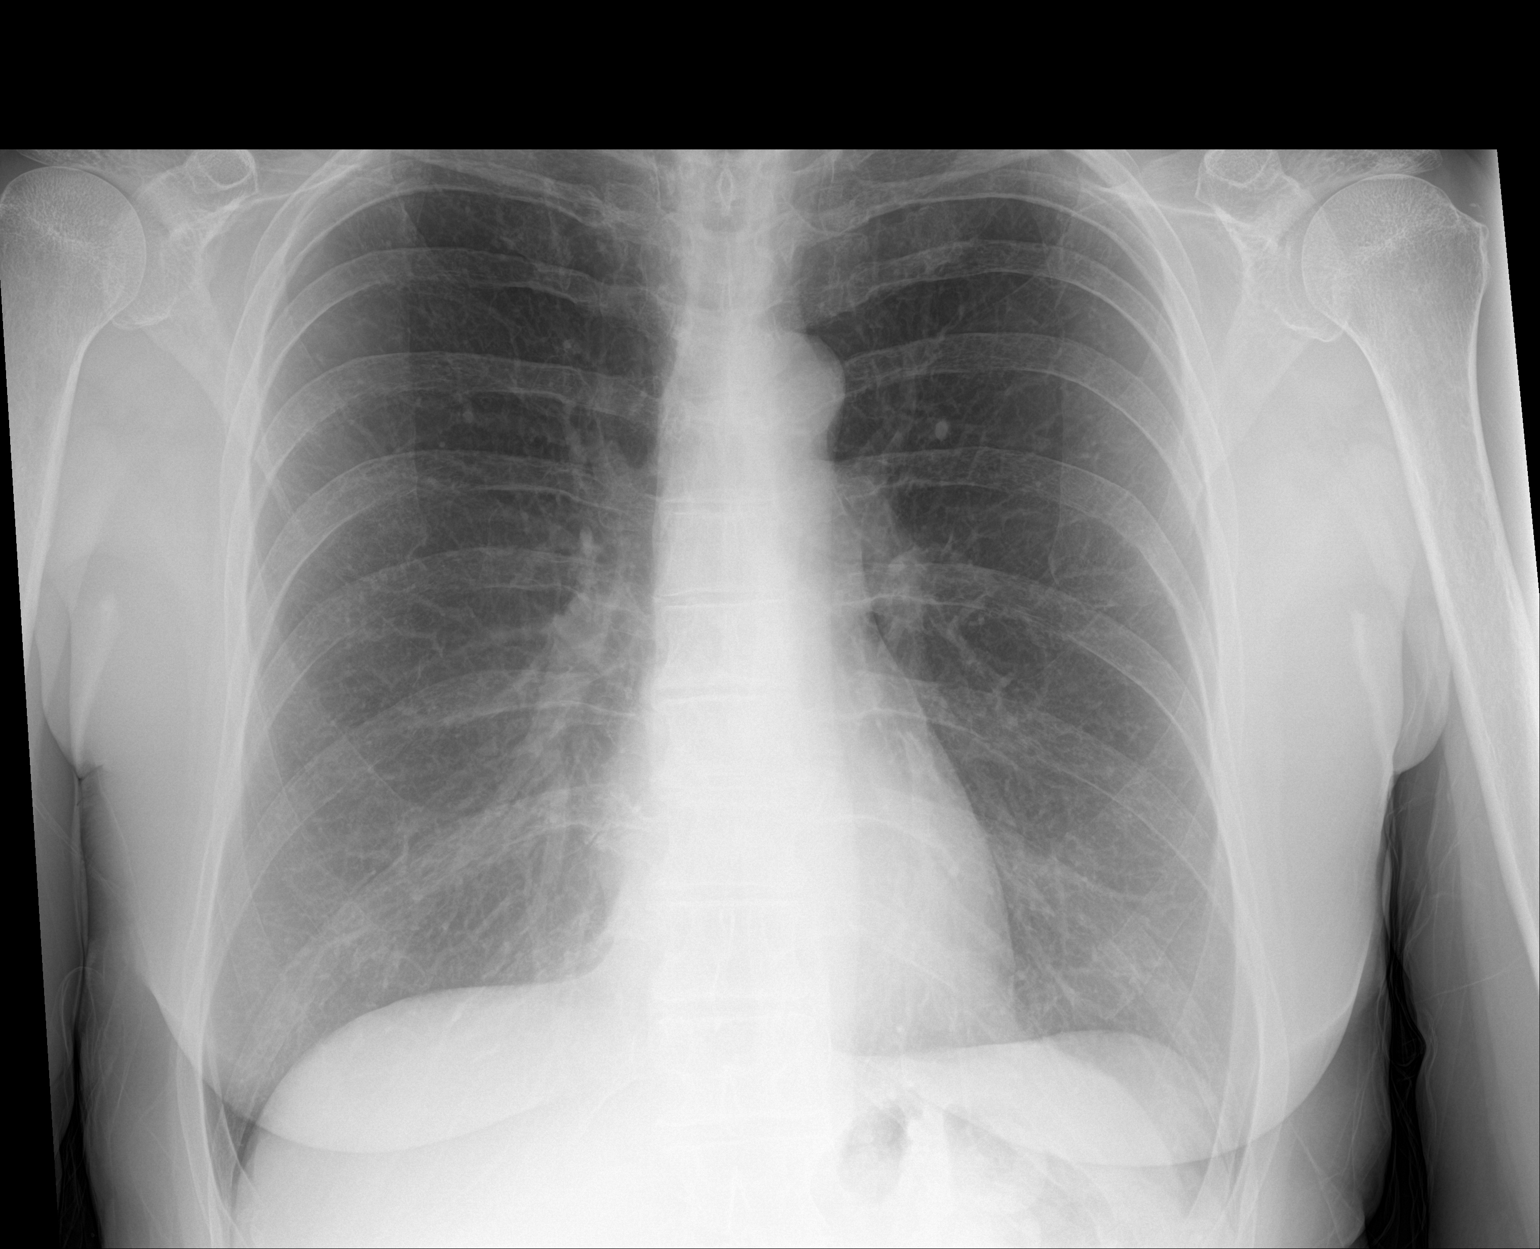

[1 of 1 positions shown; findings below may reference images not displayed]

FINDINGS: The heart size and mediastinal contours are within normal limits.
Both lungs are clear. The visualized skeletal structures are
unremarkable.
IMPRESSION: No active disease.

## 2021-05-22 ENCOUNTER — Emergency Department (HOSPITAL_BASED_OUTPATIENT_CLINIC_OR_DEPARTMENT_OTHER)
Admission: EM | Admit: 2021-05-22 | Discharge: 2021-05-22 | Disposition: A | Discharge status: Home / Self Care | Payer: Medicare HMO | Attending: Emergency Medicine | Admitting: Emergency Medicine

## 2021-05-22 ENCOUNTER — Other Ambulatory Visit: Payer: Self-pay

## 2021-05-22 ENCOUNTER — Encounter (HOSPITAL_BASED_OUTPATIENT_CLINIC_OR_DEPARTMENT_OTHER): Payer: Self-pay | Admitting: *Deleted

## 2021-05-22 DIAGNOSIS — Z7982 Long term (current) use of aspirin: Secondary | ICD-10-CM | POA: Diagnosis not present

## 2021-05-22 DIAGNOSIS — R002 Palpitations: Secondary | ICD-10-CM | POA: Diagnosis present

## 2021-05-22 DIAGNOSIS — Z8679 Personal history of other diseases of the circulatory system: Secondary | ICD-10-CM

## 2021-05-22 LAB — CBC
HCT: 49.3 % — ABNORMAL HIGH (ref 36.0–46.0)
Hemoglobin: 16.6 g/dL — ABNORMAL HIGH (ref 12.0–15.0)
MCH: 29.3 pg (ref 26.0–34.0)
MCHC: 33.7 g/dL (ref 30.0–36.0)
MCV: 86.9 fL (ref 80.0–100.0)
Platelets: 275 10*3/uL (ref 150–400)
RBC: 5.67 MIL/uL — ABNORMAL HIGH (ref 3.87–5.11)
RDW: 13.2 % (ref 11.5–15.5)
WBC: 7.7 10*3/uL (ref 4.0–10.5)
nRBC: 0 % (ref 0.0–0.2)

## 2021-05-22 LAB — BASIC METABOLIC PANEL
Anion gap: 12 (ref 5–15)
BUN: 22 mg/dL (ref 8–23)
CO2: 20 mmol/L — ABNORMAL LOW (ref 22–32)
Calcium: 9.6 mg/dL (ref 8.9–10.3)
Chloride: 103 mmol/L (ref 98–111)
Creatinine, Ser: 1.03 mg/dL — ABNORMAL HIGH (ref 0.44–1.00)
GFR, Estimated: 58 mL/min — ABNORMAL LOW (ref >60)
Glucose, Bld: 93 mg/dL (ref 70–99)
Potassium: 3.9 mmol/L (ref 3.5–5.1)
Sodium: 135 mmol/L (ref 135–145)

## 2021-05-22 NOTE — ED Triage Notes (Signed)
Pt reports hx of afib x 4 years and takes diltiazem. States HR fast for the last 6-7 hours and she feels dizzy

## 2021-05-22 NOTE — ED Provider Notes (Addendum)
[facility] EMERGENCY DEPARTMENT Provider Note   CSN: [id number] Arrival date & time: 05/22/21  1540     History Chief Complaint  Patient presents with  . Atrial Fibrillation    Kimberly Johnston is a 70 y.o. female.  Patient with hx afib, presents with palpitations. Symptoms acute onset in past day, at rest, mild-moderate, persistent. States recent stressor w argument w family member and states in past often stress will trigger episodes. No syncope. No chest pain or discomfort. No sob or unusual doe. No fever or chills. No heat intolerance/sweats. No recent change in meds. No hx etoh abuse.   The history is provided by the patient.  Atrial Fibrillation Pertinent negatives include no chest pain, no abdominal pain, no headaches and no shortness of breath.       Past Medical History:  Diagnosis Date  . Atrial fibrillation (HCC)     There are no problems to display for this patient.   Past Surgical History:  Procedure Laterality Date  . ABDOMINAL HYSTERECTOMY       OB History   No obstetric history on file.     No family history on file.  Social History   Tobacco Use  . Smoking status: Never Smoker  . Smokeless tobacco: Never Used  Vaping Use  . Vaping Use: Never used  Substance Use Topics  . Alcohol use: No  . Drug use: No    Home Medications Prior to Admission medications   Medication Sig Start Date End Date Taking? Authorizing Provider  aspirin 81 MG tablet Take 405 mg by mouth daily.    [provider]  benzonatate (TESSALON) 100 MG capsule Take 1 capsule (100 mg total) by mouth every 8 (eight) hours. 05/25/20   Melene Plan, DO  cyclobenzaprine (FLEXERIL) 10 MG tablet Take 1 tablet (10 mg total) by mouth 2 (two) times daily as needed for muscle spasms. 10/20/15   Mesner, Barbara Cower, MD  diltiazem (CARDIZEM) 120 MG tablet Take 120 mg by mouth 4 (four) times daily.    [provider]  HYDROcodone-acetaminophen (NORCO/VICODIN) 5-325  MG per tablet Take 1 tablet by mouth every 6 (six) hours as needed for pain. 03/30/13   Roxy Horseman, PA-C  ondansetron (ZOFRAN ODT) 4 MG disintegrating tablet 4mg  ODT q4 hours prn nausea/vomit 05/25/20   05/27/20, DO    Allergies    Ibuprofen and Nickel  Review of Systems   Review of Systems  Constitutional: Negative for fever.  HENT: Negative for sore throat.   Eyes: Negative for redness.  Respiratory: Negative for cough and shortness of breath.   Cardiovascular: Positive for palpitations. Negative for chest pain and leg swelling.  Gastrointestinal: Negative for abdominal pain and vomiting.  Genitourinary: Negative for flank pain.  Musculoskeletal: Negative for back pain and neck pain.  Skin: Negative for rash.  Neurological: Negative for syncope and headaches.  Hematological: Does not bruise/bleed easily.  Psychiatric/Behavioral: Negative for confusion.    Physical Exam Updated Vital Signs Ht 1.715 m (5' 7.5")   Wt 68 kg   BMI 23.15 kg/m   Physical Exam Vitals and nursing note reviewed.  Constitutional:      Appearance: Normal appearance. She is well-developed.  HENT:     Head: Atraumatic.     Nose: Nose normal.     Mouth/Throat:     Mouth: Mucous membranes are moist.  Eyes:     General: No scleral icterus.    Conjunctiva/sclera: Conjunctivae normal.  Neck:  Trachea: No tracheal deviation.     Comments: Thyroid not grossly enlarged or tender.  Cardiovascular:     Rate and Rhythm: Normal rate and regular rhythm.     Pulses: Normal pulses.     Heart sounds: Normal heart sounds. No murmur heard. No friction rub. No gallop.   Pulmonary:     Effort: Pulmonary effort is normal. No respiratory distress.     Breath sounds: Normal breath sounds.  Abdominal:     General: Bowel sounds are normal. There is no distension.     Palpations: Abdomen is soft.     Tenderness: There is no abdominal tenderness.  Genitourinary:    Comments: No cva tenderness.   Musculoskeletal:        General: No swelling or tenderness.     Cervical back: Normal range of motion and neck supple. No rigidity. No muscular tenderness.     Right lower leg: No edema.     Left lower leg: No edema.  Skin:    General: Skin is warm and dry.     Findings: No rash.  Neurological:     Mental Status: She is alert.     Coordination: Abnormal coordination: .nn.     Comments: Alert, speech normal.   Psychiatric:        Mood and Affect: Mood normal.     ED Results / Procedures / Treatments   Labs (all labs ordered are listed, but only abnormal results are displayed) Results for orders placed or performed during the hospital encounter of 05/22/21  CBC  Result Value Ref Range   WBC 7.7 4.0 - 10.5 K/uL   RBC 5.67 (H) 3.87 - 5.11 MIL/uL   Hemoglobin 16.6 (H) 12.0 - 15.0 g/dL   HCT 32.9 (H) 51.8 - 84.1 %   MCV 86.9 80.0 - 100.0 fL   MCH 29.3 26.0 - 34.0 pg   MCHC 33.7 30.0 - 36.0 g/dL   RDW 66.0 63.0 - 16.0 %   Platelets 275 150 - 400 K/uL   nRBC 0.0 0.0 - 0.2 %  Basic metabolic panel  Result Value Ref Range   Sodium 135 135 - 145 mmol/L   Potassium 3.9 3.5 - 5.1 mmol/L   Chloride 103 98 - 111 mmol/L   CO2 20 (L) 22 - 32 mmol/L   Glucose, Bld 93 70 - 99 mg/dL   BUN 22 8 - 23 mg/dL   Creatinine, Ser 1.09 (H) 0.44 - 1.00 mg/dL   Calcium 9.6 8.9 - 32.3 mg/dL   GFR, Estimated 58 (L) >60 mL/min   Anion gap 12 5 - 15   EKG EKG Interpretation  Date/Time:  Saturday May 22 2021 15:49:52 EDT Ventricular Rate:  78 PR Interval:  177 QRS Duration: 89 QT Interval:  376 QTC Calculation: 429 R Axis:   268 Text Interpretation: Sinus rhythm `no acute st/t changes No previous tracing Confirmed by Cathren Laine ([id number]) on 05/22/2021 3:57:26 PM   Radiology No results found.  Procedures Procedures   Medications Ordered in ED Medications - No data to display  ED Course  I have reviewed the triage vital signs and the nursing notes.  Pertinent labs & imaging results  that were available during my care of the patient were reviewed by me and considered in my medical decision making (see chart for details).    MDM Rules/Calculators/A&P  Continuous pulse ox and cardiac monitoring. Stat labs. Ecg.   Reviewed nursing notes and prior charts for additional history.   Labs reviewed/interpreted by me - chem normal. Wbc normal.   Recheck monitor - remains in NSR.   Pt currently appears stable for d/c.   Return precautions provided.      Final Clinical Impression(s) / ED Diagnoses Final diagnoses:  None    Rx / DC Orders ED Discharge Orders    None           Cathren Laine, MD 05/22/21 1702

## 2021-05-22 NOTE — ED Notes (Signed)
ED Provider at bedside. 

## 2021-05-22 NOTE — ED Notes (Signed)
AMA process explained and MSE waiver signed by patient. 

## 2021-05-22 NOTE — Discharge Instructions (Signed)
It was our pleasure to provide your ER care today - we hope that you feel better.  Your lab tests and ecg look good. Currently you are in a normal rhythm. Drink plenty of fluids/stay well hydrated. Avoid caffeine. Minimize stress.   Follow up with your doctor this week if symptoms persists.   Return to ER if worse, new symptoms, persistent fast heart beat, chest pain, trouble breathing, fainting, or other concern.
